# Patient Record
Sex: Male | Born: 1990 | Race: White | Hispanic: No | Marital: Single | Smoking: Never smoker
Health system: Southern US, Community
[De-identification: ages and names within clinical notes are randomized; demographics above are authoritative.]

## PROBLEM LIST (undated history)

## (undated) HISTORY — PX: ORTHOPEDIC SURGERY: SHX850

---

## 2009-11-05 ENCOUNTER — Inpatient Hospital Stay (HOSPITAL_COMMUNITY): Admission: EM | Admit: 2009-11-05 | Discharge: 2009-11-09 | Payer: Self-pay | Admitting: Emergency Medicine

## 2010-02-16 ENCOUNTER — Encounter: Admission: RE | Admit: 2010-02-16 | Discharge: 2010-02-16 | Payer: Self-pay | Admitting: Internal Medicine

## 2010-12-08 LAB — COMPREHENSIVE METABOLIC PANEL
ALT: 36 U/L (ref 0–53)
Albumin: 4.1 g/dL (ref 3.5–5.2)
CO2: 27 mEq/L (ref 19–32)
Calcium: 9.1 mg/dL (ref 8.4–10.5)
Creatinine, Ser: 0.95 mg/dL (ref 0.4–1.5)
GFR calc Af Amer: >60 mL/min (ref >60)
Potassium: 3.5 mEq/L (ref 3.5–5.1)
Sodium: 138 mEq/L (ref 135–145)
Total Protein: 6.8 g/dL (ref 6.0–8.3)

## 2010-12-08 LAB — CBC
HCT: 36.9 % — ABNORMAL LOW (ref 39.0–52.0)
Hemoglobin: 16.6 g/dL (ref 13.0–17.0)
MCHC: 34.5 g/dL (ref 30.0–36.0)
MCHC: 35.4 g/dL (ref 30.0–36.0)
MCV: 87.2 fL (ref 78.0–100.0)
Platelets: 256 10*3/uL (ref 150–400)
RBC: 4.23 MIL/uL (ref 4.22–5.81)
RBC: 5.53 MIL/uL (ref 4.22–5.81)
WBC: 15.5 10*3/uL — ABNORMAL HIGH (ref 4.0–10.5)

## 2010-12-08 LAB — LACTIC ACID, PLASMA: Lactic Acid, Venous: 3 mmol/L — ABNORMAL HIGH (ref 0.5–2.2)

## 2010-12-08 LAB — SAMPLE TO BLOOD BANK

## 2010-12-08 LAB — PROTIME-INR
INR: 1.16 (ref 0.00–1.49)
Prothrombin Time: 14.7 seconds (ref 11.6–15.2)

## 2012-06-15 ENCOUNTER — Encounter (HOSPITAL_BASED_OUTPATIENT_CLINIC_OR_DEPARTMENT_OTHER): Payer: Self-pay | Admitting: Emergency Medicine

## 2012-06-15 ENCOUNTER — Emergency Department (HOSPITAL_BASED_OUTPATIENT_CLINIC_OR_DEPARTMENT_OTHER): Payer: BC Managed Care – PPO

## 2012-06-15 ENCOUNTER — Emergency Department (HOSPITAL_BASED_OUTPATIENT_CLINIC_OR_DEPARTMENT_OTHER)
Admission: EM | Admit: 2012-06-15 | Discharge: 2012-06-15 | Disposition: A | Discharge status: Home / Self Care | Payer: BC Managed Care – PPO | Attending: Emergency Medicine | Admitting: Emergency Medicine

## 2012-06-15 DIAGNOSIS — S92403B Displaced unspecified fracture of unspecified great toe, initial encounter for open fracture: Secondary | ICD-10-CM

## 2012-06-15 DIAGNOSIS — W230XXA Caught, crushed, jammed, or pinched between moving objects, initial encounter: Secondary | ICD-10-CM | POA: Insufficient documentation

## 2012-06-15 DIAGNOSIS — S92919B Unspecified fracture of unspecified toe(s), initial encounter for open fracture: Secondary | ICD-10-CM | POA: Insufficient documentation

## 2012-06-15 MED ORDER — CEPHALEXIN 500 MG PO CAPS: 500.0000 mg | ORAL_CAPSULE | Freq: Four times a day (QID) | ORAL | Status: DC

## 2012-06-15 MED ORDER — OXYCODONE-ACETAMINOPHEN 5-325 MG PO TABS: 1.0000 | ORAL_TABLET | Freq: Four times a day (QID) | ORAL | Status: DC | PRN

## 2012-06-15 MED ORDER — LIDOCAINE HCL 2 % IJ SOLN
5.0000 mL | Freq: Once | INTRAMUSCULAR | Status: DC
  Filled 2012-06-15: qty 20

## 2012-06-15 NOTE — ED Notes (Signed)
LT grt toe charted in error for pain assessment
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
## 2013-03-28 ENCOUNTER — Other Ambulatory Visit: Payer: Self-pay | Admitting: Family Medicine

## 2013-03-28 DIAGNOSIS — M5430 Sciatica, unspecified side: Secondary | ICD-10-CM

## 2013-04-02 ENCOUNTER — Other Ambulatory Visit: Payer: BC Managed Care – PPO

## 2013-04-29 ENCOUNTER — Ambulatory Visit
Admission: RE | Admit: 2013-04-29 | Discharge: 2013-04-29 | Disposition: A | Discharge status: Home / Self Care | Payer: BC Managed Care – PPO | Source: Ambulatory Visit | Attending: Family Medicine | Admitting: Family Medicine

## 2013-04-29 DIAGNOSIS — M5430 Sciatica, unspecified side: Secondary | ICD-10-CM

## 2013-05-23 ENCOUNTER — Other Ambulatory Visit: Payer: Self-pay | Admitting: Neurosurgery

## 2013-05-23 DIAGNOSIS — M5126 Other intervertebral disc displacement, lumbar region: Secondary | ICD-10-CM

## 2013-05-26 ENCOUNTER — Ambulatory Visit
Admission: RE | Admit: 2013-05-26 | Discharge: 2013-05-26 | Disposition: A | Discharge status: Home / Self Care | Payer: BC Managed Care – PPO | Source: Ambulatory Visit | Attending: Neurosurgery | Admitting: Neurosurgery

## 2013-05-26 VITALS — BP 130/78 | HR 75

## 2013-05-26 DIAGNOSIS — M5126 Other intervertebral disc displacement, lumbar region: Secondary | ICD-10-CM

## 2013-05-26 MED ORDER — IOHEXOL 180 MG/ML  SOLN
1.0000 mL | Freq: Once | INTRAMUSCULAR | Status: AC | PRN
  Administered 2013-05-26: 1 mL via EPIDURAL

## 2013-05-26 MED ORDER — METHYLPREDNISOLONE ACETATE 40 MG/ML INJ SUSP (RADIOLOG
120.0000 mg | Freq: Once | INTRAMUSCULAR | Status: AC
  Administered 2013-05-26: 120 mg via EPIDURAL

## 2015-02-15 ENCOUNTER — Emergency Department (HOSPITAL_COMMUNITY): Payer: 59

## 2015-02-15 ENCOUNTER — Emergency Department (HOSPITAL_COMMUNITY)
Admission: EM | Admit: 2015-02-15 | Discharge: 2015-02-15 | Disposition: A | Discharge status: Home / Self Care | Payer: 59 | Attending: Emergency Medicine | Admitting: Emergency Medicine

## 2015-02-15 ENCOUNTER — Encounter (HOSPITAL_COMMUNITY): Payer: Self-pay | Admitting: Emergency Medicine

## 2015-02-15 DIAGNOSIS — S79921A Unspecified injury of right thigh, initial encounter: Secondary | ICD-10-CM | POA: Diagnosis present

## 2015-02-15 DIAGNOSIS — S4992XA Unspecified injury of left shoulder and upper arm, initial encounter: Secondary | ICD-10-CM | POA: Insufficient documentation

## 2015-02-15 DIAGNOSIS — S7011XA Contusion of right thigh, initial encounter: Secondary | ICD-10-CM | POA: Insufficient documentation

## 2015-02-15 DIAGNOSIS — Y998 Other external cause status: Secondary | ICD-10-CM | POA: Insufficient documentation

## 2015-02-15 DIAGNOSIS — S299XXA Unspecified injury of thorax, initial encounter: Secondary | ICD-10-CM | POA: Insufficient documentation

## 2015-02-15 DIAGNOSIS — Z720 Tobacco use: Secondary | ICD-10-CM | POA: Insufficient documentation

## 2015-02-15 DIAGNOSIS — S29092A Other injury of muscle and tendon of back wall of thorax, initial encounter: Secondary | ICD-10-CM | POA: Insufficient documentation

## 2015-02-15 DIAGNOSIS — T148XXA Other injury of unspecified body region, initial encounter: Secondary | ICD-10-CM

## 2015-02-15 DIAGNOSIS — Y9389 Activity, other specified: Secondary | ICD-10-CM | POA: Diagnosis not present

## 2015-02-15 DIAGNOSIS — Y92410 Unspecified street and highway as the place of occurrence of the external cause: Secondary | ICD-10-CM | POA: Insufficient documentation

## 2015-02-15 DIAGNOSIS — Z79899 Other long term (current) drug therapy: Secondary | ICD-10-CM | POA: Insufficient documentation

## 2015-02-15 LAB — CBC WITH DIFFERENTIAL/PLATELET
BASOS ABS: 0 10*3/uL (ref 0.0–0.1)
Basophils Relative: 0 % (ref 0–1)
EOS ABS: 0.1 10*3/uL (ref 0.0–0.7)
Eosinophils Relative: 0 % (ref 0–5)
HEMATOCRIT: 46.2 % (ref 39.0–52.0)
HEMOGLOBIN: 16.2 g/dL (ref 13.0–17.0)
LYMPHS ABS: 2.4 10*3/uL (ref 0.7–4.0)
LYMPHS PCT: 13 % (ref 12–46)
MCH: 29.9 pg (ref 26.0–34.0)
MCHC: 35.1 g/dL (ref 30.0–36.0)
MCV: 85.2 fL (ref 78.0–100.0)
MONO ABS: 1.1 10*3/uL — AB (ref 0.1–1.0)
Monocytes Relative: 6 % (ref 3–12)
NEUTROS ABS: 15.4 10*3/uL — AB (ref 1.7–7.7)
Neutrophils Relative %: 81 % — ABNORMAL HIGH (ref 43–77)
Platelets: 271 10*3/uL (ref 150–400)
RBC: 5.42 MIL/uL (ref 4.22–5.81)
RDW: 12.6 % (ref 11.5–15.5)
WBC: 19 10*3/uL — ABNORMAL HIGH (ref 4.0–10.5)

## 2015-02-15 LAB — I-STAT CHEM 8, ED
BUN: 15 mg/dL (ref 6–20)
CALCIUM ION: 1.21 mmol/L (ref 1.12–1.23)
CREATININE: 1 mg/dL (ref 0.61–1.24)
Chloride: 103 mmol/L (ref 101–111)
Glucose, Bld: 87 mg/dL (ref 65–99)
HEMATOCRIT: 50 % (ref 39.0–52.0)
HEMOGLOBIN: 17 g/dL (ref 13.0–17.0)
POTASSIUM: 3.7 mmol/L (ref 3.5–5.1)
SODIUM: 142 mmol/L (ref 135–145)
TCO2: 23 mmol/L (ref 0–100)

## 2015-02-15 MED ORDER — HYDROMORPHONE HCL 1 MG/ML IJ SOLN
1.0000 mg | Freq: Once | INTRAMUSCULAR | Status: AC
  Administered 2015-02-15: 1 mg via INTRAVENOUS
  Filled 2015-02-15: qty 1

## 2015-02-15 MED ORDER — IOHEXOL 300 MG/ML  SOLN
75.0000 mL | Freq: Once | INTRAMUSCULAR | Status: AC | PRN
  Administered 2015-02-15: 75 mL via INTRAVENOUS

## 2015-02-15 MED ORDER — OXYCODONE-ACETAMINOPHEN 5-325 MG PO TABS
1.0000 | ORAL_TABLET | Freq: Once | ORAL | Status: AC
  Administered 2015-02-15: 1 via ORAL
  Filled 2015-02-15: qty 1

## 2015-02-15 MED ORDER — OXYCODONE-ACETAMINOPHEN 5-325 MG PO TABS: 1.0000 | ORAL_TABLET | ORAL | Status: DC | PRN

## 2015-02-15 MED ORDER — SODIUM CHLORIDE 0.9 % IV BOLUS (SEPSIS)
1000.0000 mL | Freq: Once | INTRAVENOUS | Status: AC
  Administered 2015-02-15: 1000 mL via INTRAVENOUS

## 2015-02-15 MED ORDER — IBUPROFEN 600 MG PO TABS: 600.0000 mg | ORAL_TABLET | Freq: Four times a day (QID) | ORAL | Status: DC | PRN

## 2015-02-15 NOTE — ED Notes (Signed)
MD at bedside. 

## 2015-02-15 NOTE — ED Notes (Signed)
Dr. Nanavati at bedside 
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
## 2015-02-15 NOTE — ED Notes (Signed)
Patient transported to X-ray and CT 
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
## 2022-06-26 ENCOUNTER — Encounter: Payer: Self-pay | Admitting: Emergency Medicine

## 2022-06-26 ENCOUNTER — Ambulatory Visit
Admission: EM | Admit: 2022-06-26 | Discharge: 2022-06-26 | Disposition: A | Discharge status: Home / Self Care | Payer: BC Managed Care – PPO | Attending: Urgent Care | Admitting: Urgent Care

## 2022-06-26 DIAGNOSIS — R21 Rash and other nonspecific skin eruption: Secondary | ICD-10-CM

## 2022-06-26 MED ORDER — PREDNISONE 10 MG (21) PO TBPK: ORAL_TABLET | Freq: Every day | ORAL | 0 refills | Status: AC

## 2022-06-26 MED ORDER — PERMETHRIN 5 % EX CREA: TOPICAL_CREAM | CUTANEOUS | 0 refills | Status: AC

## 2022-06-26 MED ORDER — HYDROXYZINE HCL 25 MG PO TABS: 25.0000 mg | ORAL_TABLET | Freq: Four times a day (QID) | ORAL | 0 refills | Status: AC | PRN
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
## 2022-06-26 NOTE — Discharge Instructions (Signed)
Your rash could possibly be secondary to scabies. Please apply permethrin cream from your chin down, covering your entire body.  You must leave on for minimum of 8 hours, preferably 12 before washing off.  We will also cover you for a possible contact dermatitis, which is like an allergic reaction of the skin.  Please discontinue your current hydroxyzine.  Start taking the 1 prescribed today as needed for itching. Please take prednisone per Dosepak instructions.  Return to the clinic if your rash is not improved/resolved within the next 7 days.

## 2022-06-26 NOTE — ED Triage Notes (Signed)
Patient c/o rash on upper chest, back and shoulders x 1 week.  Very itchy.  Patient has taken Benadryl, applied Triamcinolone cream and Hydroxyzine w/o much relief.  Patient changed his laundry detergent to see if that helped, no change.
